# Patient Record
Sex: Male | Born: 1970 | Race: White | Hispanic: No | Marital: Single
Health system: Midwestern US, Community
[De-identification: ages and names within clinical notes are randomized; demographics above are authoritative.]

---

## 2018-05-12 NOTE — ED Provider Notes (Signed)
Formatting of this note is different from the original.    History     Chief Complaint     Shoulder Pain       Pt present with shooting pain form left shoulder down to left arm when driving still with left arm numbness denies any chest pain     History provided by: patient.   Nursing note reviewed and I agree with the documentation of the past medical, past surgical, social, and family histories.Vitals reviewed.  This is a new problem. The current episode started today. The problem occurs constantly. The problem has been gradually improving. The pain is present in the left upper arm. The pain is at a severity of 3/10. The pain is mild. Associated symptoms include numbness. Pertinent negatives include full range of motion, no stiffness, no tingling, no itching, no inability to bear weight, no loss of motion, no chest pain, no shortness of breath and no cough. Risk factors include none.     Past Medical History:   Diagnosis Date   ? 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 HAND TENDON SURGERY       Family History   Problem Relation Age of Onset   ? Diabetes Mother    ? Cancer Father         colon     Social History     Social History   ? Marital status: Married     Spouse name: N/A   ? Number of children: N/A   ? Years of education: N/A     Social History Main Topics   ? Smoking status: Current Every Day Smoker   ? Smokeless tobacco: Not on file   ? Alcohol use Yes   ? Drug use: No   ? Sexual activity: Not on file     Other Topics Concern   ? Not on file     Social History Narrative   ? No narrative on file     Allergies: Patient has no known allergies.    Prior to Admission medications    Medication Sig   ketorolac (TORADOL) 10 mg tablet Take 1 tablet (10 mg total) by mouth every 6 (six) hours as needed for pain for up to 5 days     Review of Systems   Constitutional: Negative for activity change.   HENT: Negative for congestion.    Eyes: Negative  for discharge.   Respiratory: Negative for cough and shortness of breath.    Cardiovascular: Negative for chest pain.   Gastrointestinal: Negative for abdominal distention.   Genitourinary: Negative for difficulty urinating and dysuria.   Musculoskeletal: Negative for back pain and stiffness.   Skin: Negative for color change and itching.   Neurological: Positive for numbness. Negative for dizziness and tingling.   Hematological: Negative for adenopathy.   Psychiatric/Behavioral: Negative for agitation.     Physical Exam     Vital signs upon initiating note  BP 126/75   Pulse 79   Temp 98 F (36.7 C)   Resp 18   Wt 108.4 kg (239 lb)   SpO2 95%   BMI 37.43 kg/m     Physical Exam   Nursing note reviewed and I agree with the documentation of the past medical, past surgical, social, and family histories.Vitals reviewed.  Constitutional: He is oriented to person, place, and time. He appears well-developed and well-nourished.   HENT:  Head: Normocephalic and atraumatic.   Eyes: Pupils are equal, round, and reactive to light.   Neck: Normal range of motion. Neck supple. No thyromegaly present.   Cardiovascular: Normal rate, regular rhythm, normal heart sounds and intact distal pulses.  Exam reveals no gallop and no friction rub.    No murmur heard.  Pulmonary/Chest: Effort normal and breath sounds normal. No respiratory distress. He has no wheezes.   Abdominal: Soft. Bowel sounds are normal. He exhibits no distension. There is no tenderness.   Musculoskeletal: Normal range of motion. He exhibits tenderness.   Left shoulder with full rom   Neurological: He is alert and oriented to person, place, and time. No cranial nerve deficit.   Skin: Skin is warm and dry.   Psychiatric: He has a normal mood and affect.     Lab Results:   Results for orders placed or performed during the hospital encounter of 05/12/18   Protime-INR   Result Value Ref Range    PT 13.4 12.3 - 15.3 SEC    INR 0.98 RATIO   APTT   Result Value Ref Range     PTT 28 25 - 38 SEC   Ethanol   Result Value Ref Range    ETHANOL <10 mg/dL   CBC and differential   Result Value Ref Range    WBC COUNT 10.0 4.5 - 10.8 10E9/L    RBC Count 5.32 (H) 3.80 - 5.10 10E12/L    HGB 16.8 13.5 - 17.5 g/dL    Hematocrit 48 39 - 50 %    MCV 90 80 - 100 fL    MCH 32 26 - 32 pg    MCHC 35 31 - 37 g/dL    RDW 21.312.7 08.611.5 - 57.814.5 %    PLATELET 256 130 - 400 10E9/L    MPV 8.1 (L) 9.4 - 12.4 fL    % NEUTROPHILS 77 (H) 42 - 72 %    % Lymphs 15 (L) 20 - 51 %    % Monos 6 0 - 10 %    % BASOS 1 0 - 2 %    % EOS 1 0 - 5 %    Absolute Neutrophils 7.8 (H) 1.7 - 7.0 10E9/L    Absolute Lymphs 1.5 0.9 - 2.7 10E9/L    Absolute Monos 0.6 0.2 - 0.8 10E9/L    Absolute EOS 0.1 0.0 - 0.5 10E9/L    Absolute Baso 0.1 0.0 - 0.2 10E9/L   Comprehensive metabolic panel   Result Value Ref Range    Sodium,S 140 136 - 145 mmol/L    Potassium 4.1 3.4 - 5.0 mmol/L    Chloride 104 98 - 107 mmol/L    Co2 24 22 - 29 mmol/L    Glucose 92 70 - 99 mg/dL    BUN 10 6 - 20 mg/dL    CREATININE,S 4.690.90 6.290.70 - 1.20 mg/dL    PROTEIN,TOTAL 7.7 6.6 - 8.7 g/dL    ALBUMIN,S 4.6 3.5 - 5.2 g/dL    CALCIUM,TOTAL 9.1 8.4 - 10.2 mg/dL    BILIRUBIN, TOTAL 0.4 0.0 - 1.2 mg/dL    ALKALINE PHOS 53 40 - 130 IU/L    AST (SGOT) 22 0 - 40 IU/L    ALT (SGPT) 22 0 - 41 IU/L    GLOBULIN 3.1 2.4 - 4.0 g/dL    ANION GAP 16 10 - 20    GFR Non-Afric Amer >90 >59 ml/min/1.73 m2    GFR AFRICAN AMER >  90 >59 ml/min/1.73 m2   Troponin I   Result Value Ref Range    TROPONIN-I <0.30 0.00 - 0.40 ng/mL         Imaging results:      ED Course     7:40 PM    The patient?s work up is complete, and there is no further need for emergent diagnostics or treatments. Patient is stable, nontoxic, and has vital signs without concerns. Exam is non-focal except as noted above. I have gone over the discharge instructions and reviewed the warning signs/symptoms requiring immediate re-evaluation. Pt has also received instructions, and contact information, for outpatient follow up. The  patient (or the patient?s advocate in the ED) verbalized understanding of everything discussed, and is ready for discharge.     Diagnoses this encounter:  Final diagnoses:   Cervical radiculopathy     Medications Given in ED:  Medications   aspirin chewable tablet (324 mg Oral Given 05/12/18 1639)     Medications prescribed this encounter:  Discharge Medication List as of 05/12/2018  5:22 PM     START taking these medications    Details   ketorolac (TORADOL) 10 mg tablet Take 1 tablet (10 mg total) by mouth every 6 (six) hours as needed for pain for up to 5 days, Starting Sat 05/12/2018, Until Thu 05/17/2018, Print         Patient to follow up as directed with:  Verneita Griffes, MD  [address]  [place] [address]  [phone]    In 2 days    Procedures  Calculators      No consult orders placed this encounter    Last four vital signs    05/12/18 1513   BP: 126/75   Pulse: 79   Resp: 18   Temp: 98 F (36.7 C)   SpO2: 95%   Weight: 108.4 kg (239 lb)     Electronically signed by      Di Kindle, MD  05/12/18 1940    Electronically signed by Di Kindle, MD at 05/12/2018  7:40 PM EDT

## 2018-05-12 NOTE — ED Triage Notes (Signed)
Formatting of this note might be different from the original.  PT BROUGHT IN VIA EMS C/O LEFT SHOULDER PAIN THAT RADIATED INTO CHEST AND ABD PTA, DENIES PAIN AT THIS TIME.   Electronically signed by Maeola Sarahourtney Atkinson, RN at 05/12/2018  3:13 PM EDT

## 2020-08-06 NOTE — Progress Notes (Signed)
Formatting of this note might be different from the original.    Self Swab Type: Anterior Nasal  Electronically signed by Brigid Re at 08/06/2020  3:12 PM EST

## 2022-02-07 NOTE — Progress Notes (Signed)
Formatting of this note is different from the original.     Office Note - New Patient    Pt Name:  Nicholas Rios   CSN:  [id number]   Pt DOB:    1970-12-27    MRN: [id number]    He comes today for hospitla follow up due to stroke like symptoms.    Chief Complaint   Patient presents with   ? Consult     New Patient Hospital Follow Up-Stroke like SX  Denies any abnormal SX     HPI:  Please see Dr. Etheleen Nicks note from 01/20/22:  Impression/ Plan    1. 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  There is no evidence he has had a stroke and a TIA is less likely given that he has the spinal cord tumor as a plausible alternative explanation We will repeat an MRI of the cervical spine with and without contrast to see if there is any progression of the lesion.  He may be discharged home later today.  He should follow up with Dr. George Hugh his established neurologist.  He can take one enteric coated 81 mg aspirin tablet daily for general stroke and MI prevention purposes.  Since he has not had a stroke or TIA he does not need to be discharged on atorvastatin.  He has an incidentally detected pulmonary nodule on his CT and Dr. Gilmer Mor is going to refer him to the pulmonary nodule Clinic for follow-up at this.  Supportive diagnostic testing studies include:  I recommend the following additional diagnostic testing:  TTE with bubble study  Therapeutic Interventions Include:  Current Antithrombotic Therapy: aspirin and clopidogrel (Plavix)  Current Pharmacological DVT Prophylaxis: subcutaneous enoxaparin (DVT prophylaxis dose)  Current High Intensity Statin Medication: atorvastatin 40mg  daily  Rehab Assessment has included: physical therapy, occupational therapy and speech therapy  Recommended blood pressure target: target SBP<130 and >90 and target DBP<80 and >50 gradually during this hospitalization  Additional Future Upon and Post- Discharge Diagnostic Testing Recommendations Include:  Recommended  Additional Outpatient Studies Include: none  Outpatient Post- Discharge Neurology Follow-up is recommended in:  1 month with Dr. his established outpatient neurologist  Diagnosis and care plan treatment explained in detail to the patient and questions answered.  I discussed the treatment plan and discharge follow-up plans in detail with Dr. George Hugh immediately after seeing him today.    02/07/22:  Echocardiogram showed ejection fraction 60-65%, negative bubble study, no obvious intracardiac masses thrombi or vegetations on TTE.  CT cervical spine without contrast showed no acute fracture of the cervical spine, previously noted spinal cord lesion is not well evaluated on this noncontrast CT of the spine.  MRI of the cervical spine with common pressed for comparison was recommended.    Patient presents to visit today with no new neurological concerns.  Patient states he is back to baseline since he was in the hospital in May 2023.  Patient states that he typically sees Dr. June 2023 yearly for monitoring of C1 tumor, however he is moving back to [place] soon.  He already has established neurology care there.  Patient states he does get yearly MRIs of his cervical spine to monitor the C1 tumor and he plans to continue with this plan of care and does not wish to proceed with an MRI of his cervical spine at this time as he just had 1 in November 2022.  Patient is doing well on 81 mg  aspirin daily.  His LDL was 73.  Vitamin-D 23.  Recommend over-the-counter vitamin-D supplement daily.  Hemoglobin A1c 4.9.  Blood pressure appears to be under control.  No heart palpitations, chest pain, shortness a breath or elevated heart rate.  Denies smoking.  Denies signs/symptoms of sleep apnea. Discussed adequate water intake, healthy diet and exercise as tolerated for weight loss and cerebrovascular health.  Overall, he is doing well and wishes to follow-up in the stroke clinic as needed.    Past Medical History:   Diagnosis Date   ?  Allergy 01/2009   ? Anxiety 12/2014   ? Hyperlipidemia June 2021     Past Surgical History:   Procedure Laterality Date   ? PR COLSC FLX W/RMVL OF TUMOR POLYP LESION SNARE TQ  05/05/2021       No Known Allergies  Social History     Socioeconomic History   ? Marital status: Divorced     Spouse name: Not on file   ? Number of children: Not on file   ? Years of education: Not on file   ? Highest education level: Not on file   Occupational History   ? Not on file   Tobacco Use   ? Smoking status: Some Days     Packs/day: 0.00     Years: 25.00     Pack years: 0.00     Types: Cigarettes     Start date: 09/19/1992     Last attempt to quit: 02/19/2021     Years since quitting: 0.9   ? Smokeless tobacco: Never   Vaping Use   ? Vaping Use: Never used   Substance and Sexual Activity   ? Alcohol use: Yes     Comment: Occasional drink with Dinner   ? Drug use: Never   ? Sexual activity: Yes   Other Topics Concern   ? Seat Belt Yes   ? Caffeine Concern Yes   ? Sleep Concern Yes   ? Exercise No   Social History Narrative   ? Not on file     Family History   Problem Relation Age of Onset   ? Diabetes Mother      Current Outpatient Medications:   ?  aspirin 81 MG chewable tablet, Take 1 tablet (81 mg) by mouth daily Do not start before Jan 21, 2022., Disp: 30 tablet, Rfl: 2    Review of systems:  General: No fever, chills, weight change   Hearing: No ear pain, change in hearing   Vision: No change in vision, eye pain, redness, or drainage   Respiratory: No cough, shortness of breath   Cardiac: No chest pain, palpitation, orthopnea, edema   GI: No abdominal pain, nausea, vomiting or diarrhea   GU: No increased urinary frequency, dysuria, incontinence   Vascular: No claudication.   Ortho/extremities: No joint pain, swelling, redness, limited mobility   Neuro: No dizziness, headache, focal motor or sensory deficients     Physical:    Visit Vitals  BP 128/76 (BP Site: Left Arm, Patient Position: Sitting, BP Cuff Size: Large)   Pulse 82   Ht  5\' 8"    Wt 209 lb 12.8 oz (95.2 kg)   SpO2 98%   BMI 31.90 kg/m   Smoking Status Some Days   BSA 2.14 m     Appearance: Appears stated age, no distress   Skin: Warm, dry, no rash   Nodes: No neck, axilla, inguinal adenopathy   Eyes: No jaundice, redness,  or abnormal eye movements   ENT: Normal pinnae, mouth, nose, normal conversational hearing.   Chest: Clear of rales or wheezes.    Heart: Regular rhythm, normal rate, no murmurs   Abdomen: Soft, no liver or spleen enlargment felt: normal bowel sounds   Extremities: No red, hot, or swollen joints. No edema. Normal ROM   Vascular: Normal pedal pulses, no carotid, aortic or femoral bruits     Neurologic Exam:  NIH 0.  CN:  Mental Status: Alert, normal attention and concentration, oriented fully, speech is articulate and clear, language is fluent   Mood Good with congruent affect.    Fundus Exam Normal   3rd, 4th, 6th: EOMs full, no nystagmus, no ptosis   5th: Normal facial sensation   7th: Facial movements full and symmetrical in upper and lower face   8th: Hearing intact to finger rub   9th, 10th: No hoarseness, palate symmetrical with symmetrical elevation   11th: Shoulder shrug 5/5   12th: Midline tongue protrusion with normal strength   Motor: Normal bulk and tone.  Full (5/5) power in proximal and distal upper and lower extremities   Reflexes: 2+ symmetric biceps, brachioradialis, patellar and achilles.  Bilateral flexor plantar response   Sensation: Intact sensation to light touch and temperature in upper and lower extremities   Coordination: No dysmetria on finger nose finger.  No rest or kinetic tremor   Gait/station: Normal based without difficulty standing or walking   Romberg negative.    Modified Rankin Scale: No symptoms at all    Data review including labs and imaging:   Labs and imaging reviewed.    Assessment:  51 year old male patient with stroke risk factors including dyslipidemia and vitamin-D deficiency presented to the ED with sudden onset of  left face, arm and leg paresthesias which have since resolved.  MRI brain normal.  Echocardiogram showed ejection fraction 60-65%, negative bubble study.  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  He does follow-up with outside neurologist for yearly MRIs of cervical spine.  He takes 81 mg aspirin daily for general stroke in MI prevention purposes.  CT chest from May 2023 did not show any evidence of pulmonary nodule.    Plan:  1. Continue 81 mg aspirin daily.  2.Vitamin D is Rios. Rios vitamin D has been associated with an increased risk for stroke. Taking over-the-counter vitamin D is recommended and/or increasing intake of foods rich in vitamin D such as salmon, eggs, mushrooms and milk/yogurt. May also consider increasing healthy sun exposure.  3. Follow up with neurologist in [facility] for monitoring of C1 tumor.  4. Follow-up in the stroke clinic as needed per patient request.    BMI: Body mass index is 31.9 kg/m.  Follow up Plan:  Provided exercise education for BMI above normal parameters. Provided nutritional education for BMI above normal parameters.   Discussed adequate water intake, healthy diet and exercise as tolerated for weight loss and cerebrovascular health.     Problems addressed during today's visit:  Problem List Items Addressed This Visit    None  Visit Diagnoses     Hospital discharge follow-up    -  Primary    Vitamin D deficiency           Testing and referrals ordered:   No orders of the defined types were placed in this encounter.    Return if symptoms worsen or fail to improve.  Particia Lather,  NP  02/07/2022    [facility]    Electronically signed by Wynonia Hazard, MD at 03/04/2022 10:56 AM CDT

## 2022-09-04 NOTE — Progress Notes (Signed)
Formatting of this note might be different from the original.  Throat culture negative   Electronically signed by Susette Racer. Stevie Kern, NP at 09/08/2022  2:14 PM EST

## 2022-09-04 NOTE — Progress Notes (Signed)
Formatting of this note is different from the original.  Subjective:      Patient ID: Nicholas Rios is a 51 y.o. male.  They present today with a chief complaint of Hoarse.    Vitals:    09/04/22 1806   BP: 119/75   Pulse: 71   Resp: 16   Temp: 97.7 F (36.5 C)   SpO2: 98%     51 y/o male presenting with sore throat and hoarse voice. Pt seen by ENT last week (Dr Feliberto Gottron) had laryngoscopy performed noting boggy and reddened lingual tonsils. Placed on Augmentin. Reports worsening hoarseness since last night also feels like he is having reflux into his throat. Denies fever. No drooling or swallowing difficulty. No cough, CP, SOB, wheezing or stridor. No abdominal pain or vomiting. Had follow up ENT appt in 2 weeks.    Review of Systems   HENT:  Positive for sore throat and voice change (hoarseness).        Objective:    Physical Exam  Constitutional:       General: He is awake.      Appearance: Normal appearance.   HENT:      Head: Normocephalic and atraumatic.      Right Ear: Tympanic membrane and ear canal normal.      Left Ear: Tympanic membrane and ear canal normal.      Nose: Nose normal.      Mouth/Throat:      Mouth: Mucous membranes are moist.      Pharynx: No pharyngeal swelling, oropharyngeal exudate, posterior oropharyngeal erythema or uvula swelling.   Pulmonary:      Effort: Pulmonary effort is normal. No tachypnea.      Breath sounds: Normal breath sounds. No wheezing, rhonchi or rales.   Musculoskeletal:      Cervical back: Neck supple.   Lymphadenopathy:      Cervical: No cervical adenopathy.   Neurological:      Mental Status: He is alert.              Assessment and Plan:   MDM:  Exam with no emergent findings. At this time will tx with Decadron IM to see if this gives him some improvement. Recommend voice rest. Will obtain throat cx. Recommend close ENT follow up. We discussed signs/symptoms requiring immediate eval in the ED.      Vitals this encounter:   Vitals:    09/04/22 1806   BP: 119/75    Pulse: 71   Resp: 16   Temp: 97.7 F (36.5 C)   SpO2: 98%         Diagnoses this encounter:    ICD-10-CM    1. Hoarseness of voice  R49.0 Throat culture     2. Pharyngitis, unspecified etiology  J02.9        Orders Placed  New Prescriptions    No medications on file     No results found for this visit on 09/04/22.    Patient condition:   Good    Disposition:   Home    I have gone over the discharge instructions and reviewed the warning signs/symptoms requiring immediate return or follow-up in the ED for re-evaluation. Educated patient on side effects of prescribed medications including those that may interfere with patient's ability to safely operate a vehicle or machinery. Relevant material risks of the treatment plan were reviewed with the patient and/or patient's family member or caretaker. The patient has also received instructions, and contact  information, for outpatient follow up. The patient (or the patient?s advocate in the urgent care) verbalized understanding of everything discussed and is ready for discharge.    Patient Instruction and Education:     You were seen in the urgent care today for the above diagnosis. Please be aware that you might have other medical problems that were not known or treated today. For those you should seek appropriate care either with your PCP, a specialist, or in the Emergency Department. Please follow all instructions from the medical provider you saw today in the urgent care. Please continue all your home medication(s) from your other medical providers as prescribed, unless otherwise directed by the medical provider in the urgent care. Please keep all follow-up visits you have with any other provider as scheduled.    If you notice any adverse effect/reaction to any treatments or medications, seek medical attention immediately by contacting or going to your primary care provider, any urgent care, or to the emergency department. Delay in evaluation and/or medical  treatment/management for any adverse effects or worsening of symptoms, illness or injury/injuries may lead to more serious problems or death.    Please always refer to literature (if available) provided in your discharge packet for more information. Monitor your symptoms and take the recommended medications (prescription or over the counter) as instructed.    Your PCP is listed as: Provider Not In System   Their phone number is: None   It is very important for everyone to have a Primary Care Provider (PCP) to follow up with and to discuss issues that might not have been discussed in the urgent care. If you do not have a primary care provider, you can call this number to start the process of getting one:  [phone]    If you have been prescribed any controlled medication or diphenhydramine (Benadryl), please be aware that many of these cause drowsiness. Avoid driving, operating machinery, or other activities which, if you are drowsy, may cause a danger to yourself or others.     If you have been prescribed opioids (?pain pills?, including but not limited to codeine, hydrocodone, oxycodone, tramadol, meperidine, fentanyl, morphine, and methadone) or benzodiazepines (?sedatives?, including but not limited to diazepam, lorazepam, alprazolam, clonazepam, and chlordiazepoxide), please be aware that these medications have serious risks, including addiction and overdose. Please follow-up with your doctor for any ongoing pain management needs and prescriptions. If you do not need to take all of the prescribed medication, do not share it with others. Methods of disposing of leftover medication vary in different municipalities and counties. It is recommended that you refer to your local health department, local pharmacy, or sheriff?s department to determine what methods are preferred or available in your community.    Abnormal Vital signs were discussed and/or addressed.              NOTE: [facility]  DOES NOT AND WILL NOT PROVIDE REFILL ON ANY CONTROLLED PRESCRIPTION MEDICATION(S) AT ANY OF OUR URGENT CARE CENTERS. PLEASE FOLLOW UP WITH YOUR PRIMARY CARE PHYSICIAN OR SPECIALIST FOR ADDITIONAL MANAGEMENT AND REFILLS ON MEDICATION(S).    Samuel Germany, PA  6:37 PM          Electronically signed by Samuel Germany, PA at 09/04/2022  7:02 PM EST

## 2022-09-07 NOTE — Progress Notes (Signed)
Formatting of this note is different from the original.  Name: Nicholas Rios  Medical Record Number: [id number]   Sex:  male   Date of Birth:  May 10, 1971     Primary Care Physician: Gatha Mayer, DO    Encounter Date: 09/07/2022     Subjective     CHIEF COMPLAINT:  Throat Problem (Eval. Throat )    HISTORY OF PRESENT ILLNESS:  Nicholas Rios is a 51 y.o. male that presents to the office for a reevaluation of his throat.  Patient was seen a little over a week ago and was diagnosed with lingual tonsillitis.  Patient was unable to tolerate the scope for a thorough exam.  However it did show that he had lingual tonsillitis and erythema around the arytenoid area.  He was given Augmentin.  Patient has 3 more tablets but states that he feels much better.  He was also given omeprazole.  Patient did not start that until a few days ago.  He experienced a very bad bout of reflux so he did start taking it.  Patient is concerned today because he has some hoarseness that was not present a few days ago but occurred after the bout of reflux.  He denies any other significant sinonasal symptoms at this time.    HISTORICAL INFORMATION:   Past Medical History:   Diagnosis Date   ? Anxiety 12/19/2014    Last Assessment & Plan:  Formatting of this note might be different from the original. Controlled with sertraline 25 mg daily, will continue     Past Surgical History:   Procedure Laterality Date   ? HAND SURGERY       Social History     Tobacco Use   ? Smoking status: Former     Packs/day: 1.00     Years: 30.00     Additional pack years: 0.00     Total pack years: 30.00     Types: Cigarettes     Start date: 91     Quit date: 08/15/2022     Years since quitting: 0.0   ? Smokeless tobacco: Never   Vaping Use   ? Vaping Use: Never used   Substance Use Topics   ? Alcohol use: Not Currently   ? Drug use: Never     Family History   Problem Relation Name Age of Onset   ? No Known Problems Father     ? No Known Problems Mother       No Known  Allergies    MEDICAL HISTORY INFORMATION REVIEWED:   The following portions of the patient's chart were reviewed in this encounter an updated as appropriate:  Allergies, Medications, Medical History, Surgical History, Family History, Social History.    Review of Systems  Review of systems negative except as stated above.     Vitals  Vitals:    09/07/22 1609   BP: 114/70   Pulse: 80   Temp: 36.5 C (97.7 F)   SpO2: 98%   Weight: 90.7 kg (200 lb)   Height: 5\' 8"  (1.727 m)     Objective   ENT Physical Exam  Constitutional  Appearance: patient appears well-developed, well-nourished and well-groomed,  Communication/Voice: communication appropriate for developmental age; voice quality is hoarse;  Head and Face  Appearance: head appears normal, face appears normal and face appears atraumatic;  Palpation: facial palpation normal;  Salivary: glands normal;  Ear  Auricles: right auricle normal; left auricle normal;  Ear Canals: right  ear canal normal; left ear canal normal;  Tympanic Membranes: right tympanic membrane normal; left tympanic membrane normal;  Nose  External Nose: nares patent bilaterally; external nose normal;  Internal Nose: nasal mucosa normal; nasal septal deviation not present; bilateral inferior turbinates normal;  Oral Cavity/Oropharynx  Tongue: tongue lesion or mass not present;  Oral mucosa: normal; floor of mouth with no lesion;  Hard palate: normal;  Soft palate: normal;  Neck  Neck: neck normal; neck palpation normal;  Respiratory  Auscultation: breath sounds are clear; no stridor noted;  Cardiovascular  Inspection: extremities are warm and well perfused; no peripheral edema present;  Auscultation: regular rate and rhythm;  Lymphatic  Palpation: no cervical adenopathy noted;  Neurovestibular  Gait: gait normal;  Mental Status: alert and oriented;  Psychiatric: mood normal; affect is appropriate;    IMAGING:  No imaging test was performed today.    AUDIOLOGY:  No Audiology Testing    Results for  orders placed or performed during the hospital encounter of 08/24/22   POCT Glucose Meter   Result Value Ref Range    POCT GLU 90 65.0000000 - 99.0000000 mg/dL       ENCOUNTER MEDICATIONS:   Current Outpatient Medications   Medication Sig Dispense Refill   ? amoxicillin-clavulanate (AUGMENTIN) 875-125 mg per tablet Take 1 tablet by mouth 2 (two) times a day for 10 days. 20 tablet 0   ? aspirin 81 mg chewable tablet Chew 1 tablet (81 mg total) 1 (one) time each day. 30 tablet 0   ? atorvastatin (Lipitor) 40 mg tablet Take 1 tablet (40 mg total) by mouth 1 (one) time each day. 30 tablet 0   ? azelastine (ASTELIN) 137 mcg (0.1 %) nasal spray Administer 2 sprays into each nostril 2 (two) times a day. Use in each nostril as directed 30 mL 5   ? omeprazole (PriLOSEC) 40 mg DR capsule Take 1 capsule (40 mg total) by mouth 1 (one) time each day. Do not crush or chew. 30 capsule 11   ? triamcinolone (KENALOG) 0.1 % ointment Apply topically 2 (two) times a day. 30 g 1   ? clonazePAM (KlonoPIN) 0.5 mg tablet Take 1 tablet (0.5 mg total) by mouth 1 (one) time for 1 dose. 30 mins prior to procedure. 1 tablet 0     No current facility-administered medications for this visit.     ASSESSMENT/PLAN  Assessment/Plan   Diagnoses and all orders for this visit:  Gastroesophageal reflux disease without esophagitis  Hoarseness    Medical Decision Making:   Patient and I discussed that I believe the hoarseness may be because he had a bad case of reflux.  Very likely could have irritated his vocal cords and arytenoid area more.  I would like to rescope him but not until after he has been on this medicine for at least 3 weeks.  In general patient states he feels better and he does feel like the reflux has improved since he started taking the omeprazole.  I did tell him that if the hoarseness seems to get worse for him to follow-up sooner.  FOLLOW UP:  Follow up in about 3 weeks (around 09/28/2022).    PATIENT INSTRUCTIONS:  There are no  Patient Instructions on file for this visit.    The patient was given the opportunity to ask questions, discuss concerns and participate in their plan of care.   All questions and concerns were answered.  The patient is in agreement with the treatment  plan.      Amy Rowell, FNP-C    This note was created with the use of voice recognition technology which may inadvertently result in erroneous dictation and/or typographical errors.  Please use context clues for correctness.  Please contact the author for any clarification or questions/concerns related to documentation.        Electronically signed by Margaretmary Dys, FNP-C at 09/07/2022  4:45 PM EST

## 2022-09-17 ENCOUNTER — Emergency Department: Admit: 2022-09-17 | Payer: BLUE CROSS/BLUE SHIELD

## 2022-09-17 ENCOUNTER — Inpatient Hospital Stay
Admit: 2022-09-17 | Discharge: 2022-09-18 | Disposition: A | Discharge status: Home / Self Care | Payer: BLUE CROSS/BLUE SHIELD | Attending: Emergency Medicine

## 2022-09-17 ENCOUNTER — Emergency Department: Admit: 2022-09-18 | Payer: BLUE CROSS/BLUE SHIELD

## 2022-09-17 DIAGNOSIS — R1030 Lower abdominal pain, unspecified: Secondary | ICD-10-CM

## 2022-09-17 NOTE — ED Provider Notes (Signed)
[facility]  EMERGENCY DEPARTMENT ENCOUNTER      Pt Name: Nicholas Rios  MRN: [id number]  Birthdate 09-01-1971  Date of evaluation: 09/17/2022  Provider: Gaspar Bidding, MD  Provider evaluation time: 09/17/22 1908    CHIEF COMPLAINT       Chief Complaint   Patient presents with    Testicle Pain     Pt has testicular pain that started yesterday. Pt states that the pain radiates to his abdomen and into his groin.       HISTORY OF PRESENT ILLNESS    The patient presents to the emergency department with reported pelvic and testicular pain over the last couple days.  He denies any dysuria, urinary frequency, hematuria, flank pain, fever, nausea/vomiting, or any other associated symptoms.  He was reportedly seen at an urgent care clinic 2 days ago and was prescribed trimethoprim/sulfamethoxazole as well as fluconazole, but notes that symptoms have persisted.  He denies any abdominal surgical history or any other significant past medical history.  He denies any recent trauma/injury.    The history is provided by the patient.       Nursing Notes were reviewed.    REVIEW OF SYSTEMS     Review of Systems   Constitutional:  Negative for chills and fever.   Gastrointestinal:  Positive for abdominal pain. Negative for constipation, diarrhea, nausea and vomiting.   Genitourinary:  Positive for penile pain and testicular pain. Negative for flank pain, frequency, genital sores, hematuria, penile discharge, penile swelling and scrotal swelling.   Musculoskeletal:  Negative for back pain.   Skin:  Negative for rash.   All other systems reviewed and are negative.      PAST MEDICAL & SURGICAL HISTORY   No past medical history on file.    No past surgical history on file.    CURRENT MEDICATIONS       Previous Medications    ASPIRIN 81 MG CHEWABLE TABLET    Take 1 tablet by mouth daily    ATORVASTATIN (LIPITOR) 10 MG TABLET    Take 1 tablet by mouth daily       ALLERGIES     Patient has no known allergies.    FAMILY & SOCIAL  HISTORY     No family history on file.     Social History     Socioeconomic History    Marital status: Single       PHYSICAL EXAM       ED Triage Vitals [09/17/22 1730]   BP Temp Temp Source Pulse Respirations SpO2 Height Weight - Scale   129/81 98.5 F (36.9 C) Oral 98 16 97 % -- 83.9 kg (185 lb)       Physical Exam  Vitals and nursing note reviewed.   Constitutional:       General: He is not in acute distress.     Appearance: Normal appearance. He is not ill-appearing or toxic-appearing.   HENT:      Head: Normocephalic and atraumatic.      Mouth/Throat:      Mouth: Mucous membranes are moist.      Pharynx: Oropharynx is clear.      Comments: Airway patent  Eyes:      Pupils: Pupils are equal, round, and reactive to light.   Cardiovascular:      Rate and Rhythm: Normal rate.      Pulses: Normal pulses.      Heart sounds: Normal heart sounds.  Comments: Normal peripheral perfusion  Pulmonary:      Effort: Pulmonary effort is normal. No respiratory distress.      Breath sounds: Normal breath sounds.   Abdominal:      Palpations: There is no mass.      Tenderness: There is abdominal tenderness (moderate - suprapubic). There is no right CVA tenderness or left CVA tenderness.      Hernia: There is no hernia in the left inguinal area or right inguinal area.   Genitourinary:     Pubic Area: No rash.       Penis: Normal and circumcised. No tenderness, discharge, swelling or lesions.       Testes: Normal.         Right: Mass, tenderness, swelling, testicular hydrocele or varicocele not present.         Left: Mass, tenderness, swelling, testicular hydrocele or varicocele not present.      Epididymis:      Right: Normal.      Left: Normal.   Lymphadenopathy:      Lower Body: No right inguinal adenopathy. No left inguinal adenopathy.   Skin:     General: Skin is warm and dry.      Capillary Refill: Capillary refill takes less than 2 seconds.      Findings: No rash.   Neurological:      Mental Status: He is alert and  oriented to person, place, and time. Mental status is at baseline.      Comments: Alert; Moving all extremities   Psychiatric:         Mood and Affect: Mood normal.         Behavior: Behavior normal.         Procedures    DIAGNOSTIC RESULTS     RADIOLOGY:   CT ABDOMEN PELVIS W IV CONTRAST Additional Contrast? None   Final Result      1.  No acute findings identified in the abdomen or pelvis.   2.  Cholelithiasis without evidence of acute cholecystitis.   3.  Small fat-containing left inguinal and paraumbilical hernias.      [place] DUP ABD PEL RETRO SCROT LIMITED   Final Result   Unremarkable sonographic evaluation of the testicles. No evidence of torsion.      US SCROTUM AND TESTICLES   Final Result   Unremarkable sonographic evaluation of the testicles. No evidence of torsion.           LABS:  Labs Reviewed   CBC WITH AUTO DIFFERENTIAL - Abnormal; Notable for the following components:       Result Value    WBC 10.7 (*)     Neutrophils Absolute 7.8 (*)     All other components within normal limits   COMPREHENSIVE METABOLIC PANEL - Abnormal; Notable for the following components:    Glucose 102 (*)     All other components within normal limits   URINALYSIS W/ RFLX MICROSCOPIC       All other labs were within normal range or not returned as of this dictation.    ED COURSE, REASSESSMENT and MDM:          Medical Decision Making  No acute abnormality noted on workup.  The patient does have a left inguinal hernia (small), which she notes is a known issue that he has had for some time.  Testicular ultrasound shows no acute abnormality, and CT shows no other abnormality aside from the hernia.  The patient is  on trimethoprim/sulfamethoxazole, as prescribed 2 days ago.  I suspect that he may have a partially treated UTI.  Plan to continue that as well as medication for symptom relief.  He is from [place] and is traveling home tomorrow, with a reported scheduled follow-up with his primary care physician in the next few days for  reevaluation.  I advised him to keep that appointment and to return to the ED, as needed, for any new/worsening symptoms or further concerns.    Amount and/or Complexity of Data Reviewed  Labs: ordered.  Radiology: ordered.    Risk  Prescription drug management.      FINAL IMPRESSION      1. Inguinal pain, unspecified laterality          DISPOSITION/PLAN   DISPOSITION Decision To Discharge 09/17/2022 10:05:49 PM      PATIENT REFERRED TO:  Your primary care provider      Follow-up in the next several days, To recheck your symptoms    [facility]  [address]  [place] [address]  [phone]    As needed, If symptoms worsen      DISCHARGE MEDICATIONS:  New Prescriptions    IBUPROFEN (ADVIL;MOTRIN) 800 MG TABLET    Take 1 tablet by mouth every 8 hours as needed for Pain    PHENAZOPYRIDINE (PYRIDIUM) 200 MG TABLET    Take 1 tablet by mouth 3 times daily as needed for Pain       (Please note that portions of this note were completed with a voice recognition transcription program.  Efforts were made to ensure the accuracy of the dictations, but occasionally words are mis-transcribed.)    Gaspar Bidding, MD (electronically signed)  Emergency Medicine       Gaspar Bidding, MD  09/17/22 2211

## 2022-09-17 NOTE — Discharge Instructions (Signed)
Thank you for visiting the Emergency Department today.  Your visit today is just one step in your care.  It is important to follow-up with your doctor for ongoing care.    Continue your current home medications, as already prescribed, including your current antibiotic.    Return to the Emergency Department, as needed, for any new/worsening symptoms or other concerns.     If you do not have a primary care physician or need help finding any other type of physician, you may call [phone] for assistance.

## 2022-09-18 LAB — CBC WITH AUTO DIFFERENTIAL
Absolute Baso #: 0 10*3/uL (ref 0.0–0.2)
Absolute Eos #: 0.2 10*3/uL (ref 0.0–0.5)
Absolute Lymph #: 1.9 10*3/uL (ref 1.0–3.2)
Absolute Mono #: 0.8 10*3/uL (ref 0.3–1.0)
Basophils %: 0.4 % (ref 0.0–2.0)
Eosinophils %: 1.8 % (ref 0.0–7.0)
Hematocrit: 47.9 % (ref 38.0–52.0)
Hemoglobin: 16.2 g/dL (ref 13.0–17.3)
Immature Grans (Abs): 0.02 10*3/uL (ref 0.00–0.06)
Immature Granulocytes: 0.2 % (ref 0.0–0.6)
Lymphocytes: 17.9 % (ref 15.0–45.0)
MCH: 31.2 pg (ref 27.0–34.5)
MCHC: 33.8 g/dL (ref 32.0–36.0)
MCV: 92.1 fL (ref 84.0–100.0)
MPV: 9.7 fL (ref 7.2–13.2)
Monocytes: 7.1 % (ref 4.0–12.0)
Neutrophils %: 72.6 % (ref 42.0–74.0)
Neutrophils Absolute: 7.8 10*3/uL — ABNORMAL HIGH (ref 1.6–7.3)
Platelets: 219 10*3/uL (ref 140–440)
RBC: 5.2 x10e6/mcL (ref 4.00–5.60)
RDW: 12.3 % (ref 11.0–16.0)
WBC: 10.7 10*3/uL — ABNORMAL HIGH (ref 3.8–10.6)

## 2022-09-18 LAB — URINALYSIS W/ RFLX MICROSCOPIC
Bilirubin Urine: NEGATIVE
Blood, Urine: NEGATIVE
Glucose, UA: NEGATIVE
Ketones, Urine: NEGATIVE
Leukocyte Esterase, Urine: NEGATIVE
Nitrite, Urine: NEGATIVE
Protein, UA: NEGATIVE
Specific Gravity, UA: 1.02 (ref 1.003–1.035)
Urobilinogen, Urine: 0.2 EU/dL (ref >1)
pH, UA: 6 (ref 4.5–8.0)

## 2022-09-18 LAB — COMPREHENSIVE METABOLIC PANEL
ALT: 26 U/L (ref 0–50)
AST: 24 U/L (ref 0–50)
Albumin/Globulin Ratio: 2 (ref 1.00–2.70)
Albumin: 4.3 g/dL (ref 3.5–5.2)
Alk Phosphatase: 57 U/L (ref 40–130)
Anion Gap: 6 mmol/L (ref 2–17)
BUN: 16 mg/dL (ref 6–20)
CO2: 26 mmol/L (ref 22–29)
Calcium: 8.9 mg/dL (ref 8.6–10.0)
Chloride: 106 mmol/L (ref 98–107)
Creatinine: 1 mg/dL (ref 0.7–1.3)
Est, Glom Filt Rate: 91 mL/min/1.73m (ref >=60)
Globulin: 2.8 g/dL (ref 1.9–4.4)
Glucose: 102 mg/dL — ABNORMAL HIGH (ref 70–99)
OSMOLALITY CALCULATED: 277 mOsm/kg (ref 270–287)
Potassium: 4.2 mmol/L (ref 3.5–5.3)
Sodium: 138 mmol/L (ref 135–145)
Total Bilirubin: 0.34 mg/dL (ref 0.00–1.20)
Total Protein: 7.1 g/dL (ref 6.4–8.3)

## 2022-09-18 MED ORDER — IBUPROFEN 800 MG PO TABS
800 MG | ORAL_TABLET | Freq: Three times a day (TID) | ORAL | 0 refills | Status: AC | PRN
Start: 2022-09-18 — End: ?

## 2022-09-18 MED ORDER — PHENAZOPYRIDINE HCL 200 MG PO TABS
200 MG | ORAL_TABLET | Freq: Three times a day (TID) | ORAL | 0 refills | Status: AC | PRN
Start: 2022-09-18 — End: ?

## 2022-09-18 MED ORDER — KETOROLAC TROMETHAMINE 15 MG/ML IJ SOLN
15 MG/ML | INTRAMUSCULAR | Status: AC
Start: 2022-09-18 — End: 2022-09-17
  Administered 2022-09-18: 03:00:00 15 via INTRAVENOUS

## 2022-09-18 MED ORDER — IOPAMIDOL 61 % IV SOLN
61 % | Freq: Once | INTRAVENOUS | Status: AC | PRN
Start: 2022-09-18 — End: 2022-09-17
  Administered 2022-09-18: 02:00:00 100 mL via INTRAVENOUS

## 2022-09-18 MED ORDER — KETOROLAC TROMETHAMINE 15 MG/ML IJ SOLN
15 MG/ML | Freq: Once | INTRAMUSCULAR | Status: AC
Start: 2022-09-18 — End: 2022-09-17

## 2022-09-18 MED FILL — KETOROLAC TROMETHAMINE 15 MG/ML IJ SOLN: 15 MG/ML | INTRAMUSCULAR | Qty: 1
